# Patient Record
Sex: Male | Born: 1965 | Race: Black or African American | Hispanic: No | Marital: Single | Smoking: Never smoker
Health system: Southern US, Community
[De-identification: ages and names within clinical notes are randomized; demographics above are authoritative.]

---

## 2017-04-07 ENCOUNTER — Emergency Department
Admission: EM | Admit: 2017-04-07 | Discharge: 2017-04-07 | Disposition: A | Discharge status: Home / Self Care | Payer: Self-pay | Attending: Emergency Medicine | Admitting: Emergency Medicine

## 2017-04-07 ENCOUNTER — Encounter: Payer: Self-pay | Admitting: *Deleted

## 2017-04-07 ENCOUNTER — Other Ambulatory Visit: Payer: Self-pay

## 2017-04-07 ENCOUNTER — Emergency Department: Payer: Self-pay

## 2017-04-07 DIAGNOSIS — M660 Rupture of popliteal cyst: Secondary | ICD-10-CM | POA: Insufficient documentation

## 2017-04-07 MED ORDER — OXYCODONE-ACETAMINOPHEN 5-325 MG PO TABS: 1.0000 | ORAL_TABLET | Freq: Three times a day (TID) | ORAL | 0 refills | Status: AC | PRN

## 2017-04-07 NOTE — ED Notes (Signed)
PT demonstrated use of crutches and denies needing a wheelchair. Pt also reports he is in NAD nad understand home care and treatment.

## 2017-04-07 NOTE — ED Triage Notes (Signed)
Pt reports he has right calf swelling that has worsened today after a flight. Pt reports severe pain behind right knee. Pt has pain with ambulating. No CHF. Pulse is intact and color is appropriate. Leg is not hot to the touch. No redness or cellulitis noted at this time.

## 2017-04-07 NOTE — ED Provider Notes (Addendum)
[facility] Emergency Department Provider Note       Time seen: ----------------------------------------- 6:44 PM on 04/07/2017 -----------------------------------------   I have reviewed the triage vital signs and the nursing notes.  HISTORY   Chief Complaint Leg Pain    HPI Ruben Moran is a 52 y.o. male with no significant past medical history who presents to the ED for right calf pain.  Patient reports his right calf swelling that has worsened today after a flight from [place] today.  He reports severe pain behind the right knee and has pain with ambulation.  Pain is 10 out of 10 in his right calf, aching.  Walking makes it worse.  History reviewed. No pertinent past medical history.  There are no active problems to display for this patient.   History reviewed. No pertinent surgical history.  Allergies Penicillins  Social History Social History   Tobacco Use  . Smoking status: Never Smoker  . Smokeless tobacco: Never Used  Substance Use Topics  . Alcohol use: Yes    Comment: socailly  . Drug use: No    Review of Systems Constitutional: Negative for fever. Cardiovascular: Negative for chest pain. Respiratory: Negative for shortness of breath. Gastrointestinal: Negative for abdominal pain, vomiting and diarrhea. Musculoskeletal: Positive for right calf pain Skin: Positive for right leg bruising Neurological: Negative for headaches, focal weakness or numbness.  All systems negative/normal/unremarkable except as stated in the HPI  ____________________________________________   PHYSICAL EXAM:  VITAL SIGNS: ED Triage Vitals  Enc Vitals Group     BP 04/07/17 1825 (!) 209/129     Pulse Rate 04/07/17 1825 96     Resp 04/07/17 1825 18     Temp 04/07/17 1825 98.3 F (36.8 C)     Temp Source 04/07/17 1825 Oral     SpO2 04/07/17 1825 98 %     Weight 04/07/17 1823 210 lb (95.3 kg)     Height 04/07/17 1823 5' 10.5" (1.791 m)   Head Circumference --      Peak Flow --      Pain Score 04/07/17 1823 10     Pain Loc --      Pain Edu? --      Excl. in GC? --    Constitutional: Alert and oriented. Well appearing and in no distress. Cardiovascular: Normal rate, regular rhythm. No murmurs, rubs, or gallops. Respiratory: Normal respiratory effort without tachypnea nor retractions. Breath sounds are clear and equal bilaterally. No wheezes/rales/rhonchi. Gastrointestinal: Soft and nontender. Normal bowel sounds Musculoskeletal: Right calf swelling and tenderness, extensive right lower extremity edema compared to left.  Compartments feel soft Neurologic:  Normal speech and language. No gross focal neurologic deficits are appreciated.  Skin: Skin is intact, extensive edema and erythema with ecchymosis particularly over the right calf and extending to the right distal hamstring area Psychiatric: Mood and affect are normal. Speech and behavior are normal.  ____________________________________________  ED COURSE:  As part of my medical decision making, I reviewed the following data within the electronic MEDICAL RECORD NUMBER History obtained from family if available, nursing notes, old chart and ekg, as well as notes from prior ED visits. Patient presented for right calf pain and swelling, we will assess with imaging as indicated at this time.   Procedures ____________________________________________   RADIOLOGY Images were viewed by me  Right lower extremity ultrasound  Other Findings: There is a complex fluid collection within the popliteal fossa measuring 7.4 x 2.4 x 3.3 cm (volume =  31 cm^3).  IMPRESSION: 1. No evidence for deep vein thrombosis. 2. Complex fluid collection within the popliteal fossa may represent a ruptured Baker's cyst versus hematoma. ____________________________________________  DIFFERENTIAL DIAGNOSIS   Ruptured Baker's cyst, gastrocnemius muscle injury, DVT, cellulitis  FINAL ASSESSMENT AND  PLAN  Ruptured Baker's cyst   Plan: Patient had presented for right calf pain. Patient's imaging revealed a complex fluid collection consistent with a ruptured Baker's cyst.  He will be given pain medicine, is encouraged to use ice and elevation.  Otherwise he is stable for outpatient follow-up.   Emily Filbert, MD   Note: This note was generated in part or whole with voice recognition software. Voice recognition is usually quite accurate but there are transcription errors that can and very often do occur. I apologize for any typographical errors that were not detected and corrected.     Emily Filbert, MD 04/07/17 2030    Emily Filbert, MD 04/07/17 2039

## 2017-04-07 NOTE — ED Notes (Signed)
BP Checked: 178/130 Left Arm

## 2018-06-24 IMAGING — US US EXTREM LOW VENOUS*R*
1 series · 13 of 24 positions shown · non-contrast
Comparison: None.

CLINICAL DATA: Leg pain.  Swelling.



[Series 1: us extrem low venous*right* · 0.07mm/px · 13 of 39 slices shown]
[im 1/39]
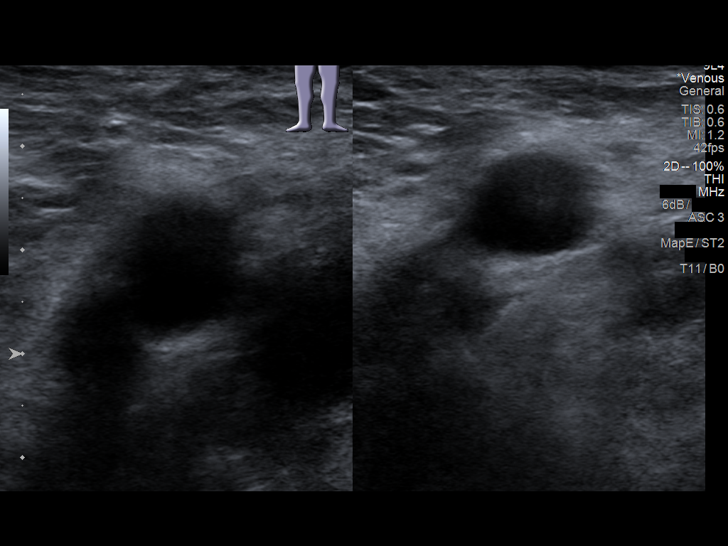
[im 4/39]
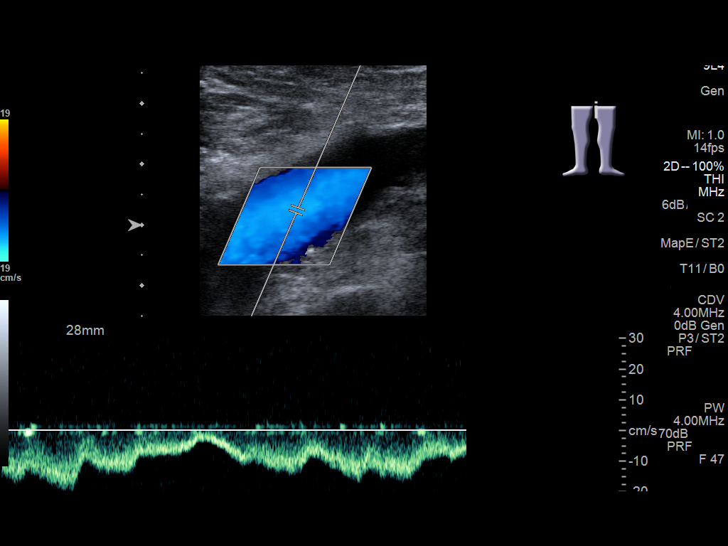
[im 7/39]
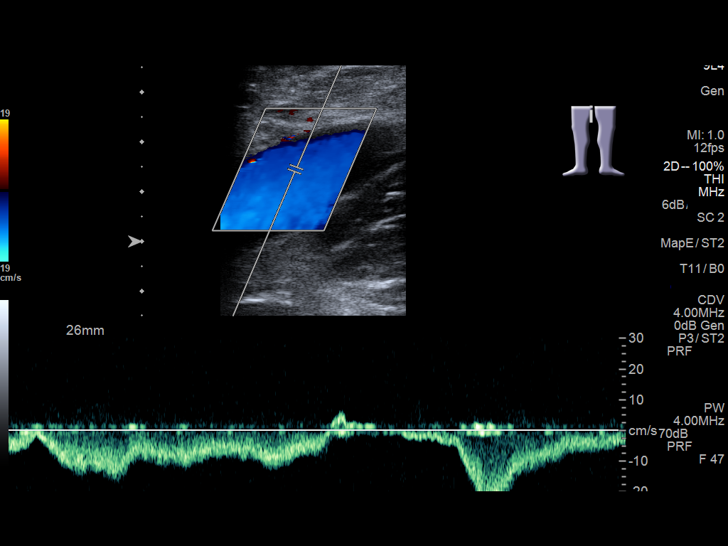
[im 10/39]
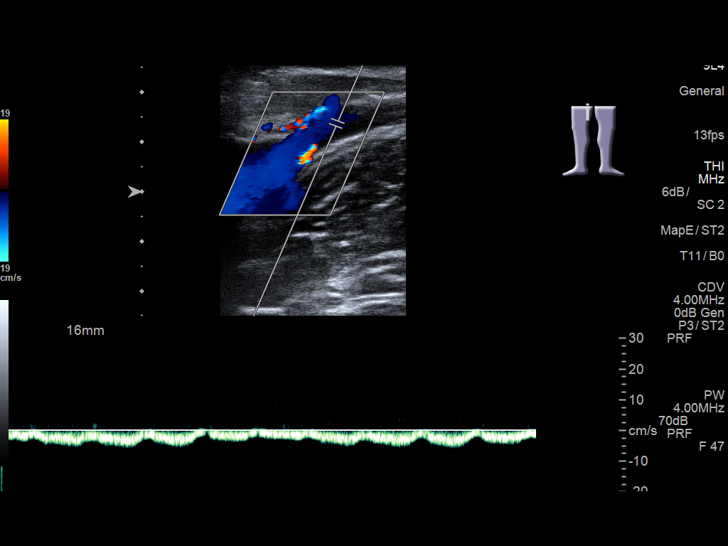
[im 14/39]
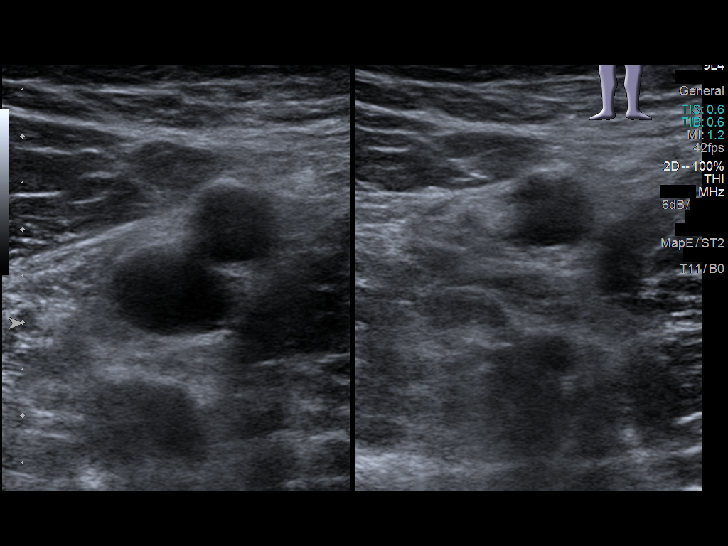
[im 17/39]
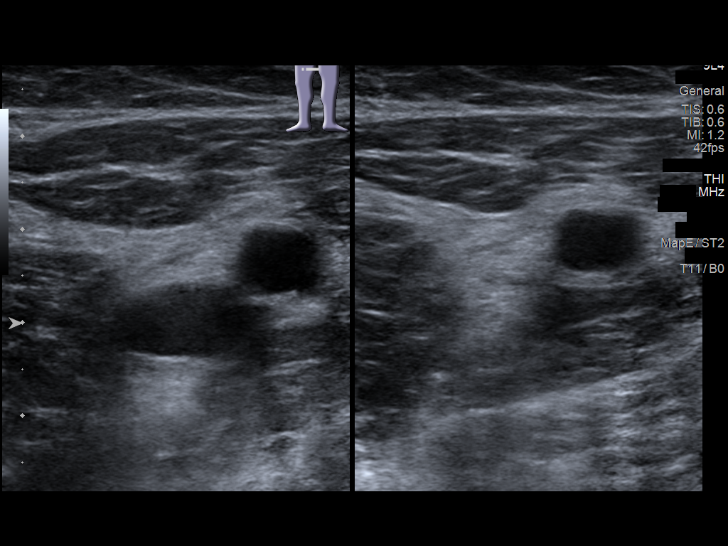
[im 20/39]
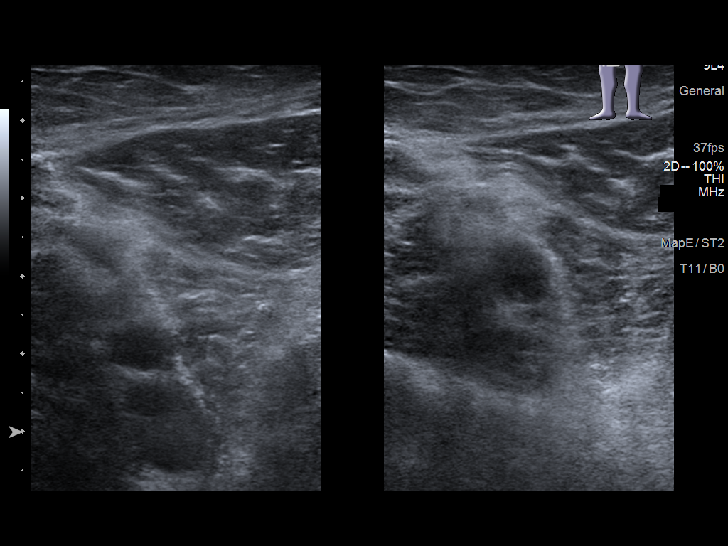
[im 22/39]
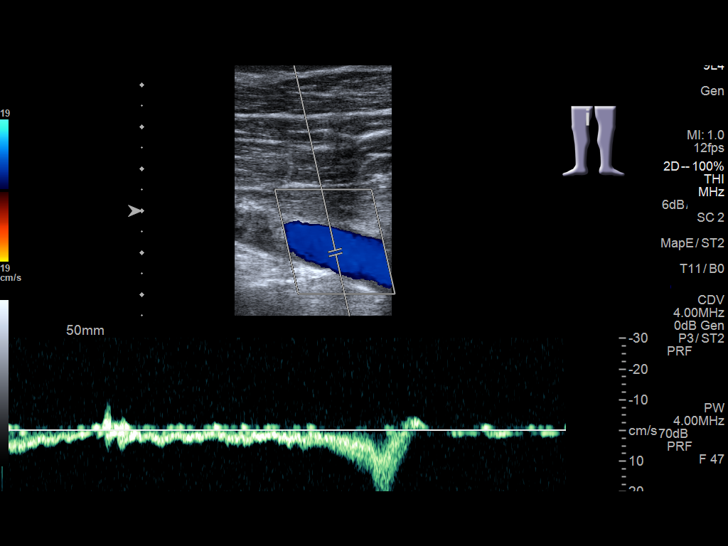
[im 25/39]
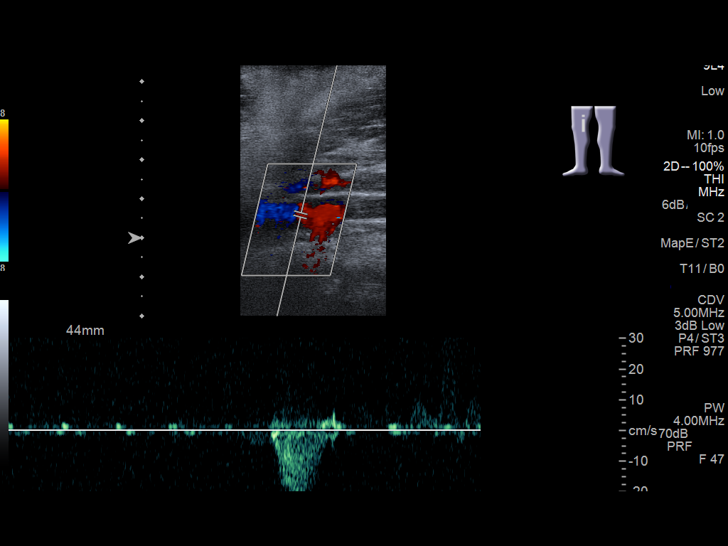
[im 29/39]
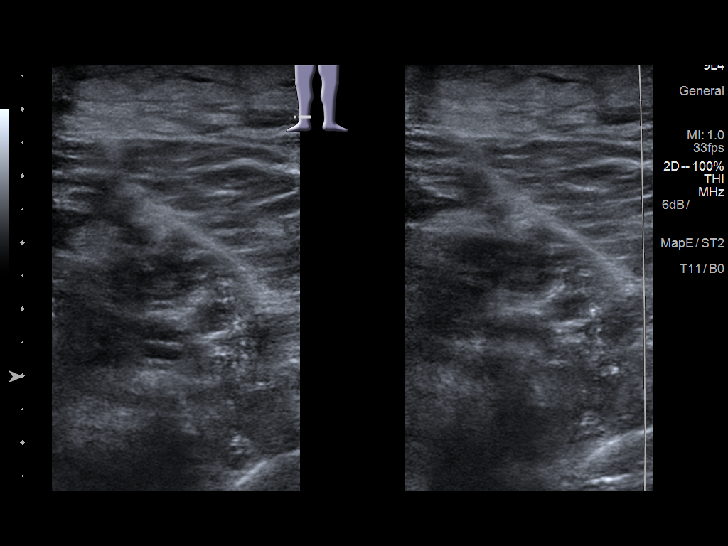
[im 32/39]
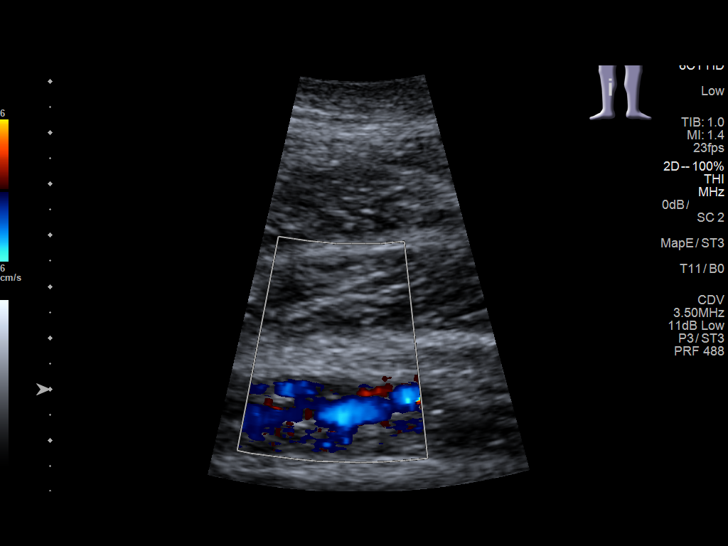
[im 35/39]
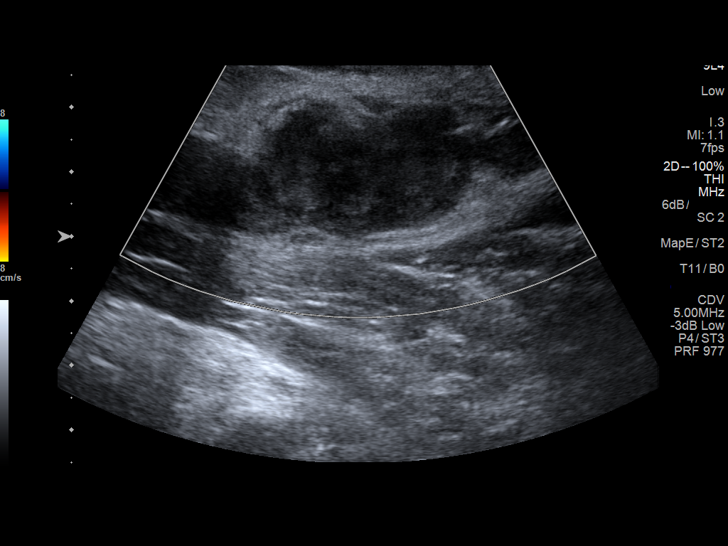
[im 39/39]
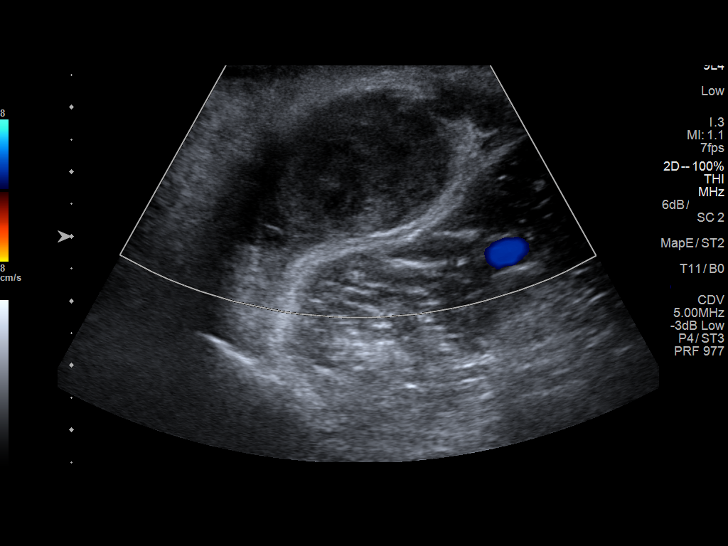

[13 of 24 positions shown; findings below may reference images not displayed]

FINDINGS: Contralateral Common Femoral Vein: Respiratory phasicity is normal
and symmetric with the symptomatic side. No evidence of thrombus.
Normal compressibility.

Common Femoral Vein: No evidence of thrombus. Normal
compressibility, respiratory phasicity and response to augmentation.

Saphenofemoral Junction: No evidence of thrombus. Normal
compressibility and flow on color Doppler imaging.

Profunda Femoral Vein: No evidence of thrombus. Normal
compressibility and flow on color Doppler imaging.

Femoral Vein: No evidence of thrombus. Normal compressibility,
respiratory phasicity and response to augmentation.

Popliteal Vein: No evidence of thrombus. Normal compressibility,
respiratory phasicity and response to augmentation.

Calf Veins: No evidence of thrombus. Normal compressibility and flow
on color Doppler imaging.

Superficial Great Saphenous Vein: No evidence of thrombus. Normal
compressibility.

Venous Reflux:  None.

Other Findings: There is a complex fluid collection within the
popliteal fossa measuring 7.4 x 2.4 x 3.3 cm (volume = 31 cm^3).
IMPRESSION: 1. No evidence for deep vein thrombosis.
2. Complex fluid collection within the popliteal fossa may represent
a ruptured Baker's cyst versus hematoma.
